# Patient Record
Sex: Female | Born: 1970 | Race: Black or African American | Hispanic: No | Marital: Single | Smoking: Former smoker
Health system: Southern US, Community
[De-identification: ages and names within clinical notes are randomized; demographics above are authoritative.]

## PROBLEM LIST (undated history)

## (undated) HISTORY — PX: TUBAL LIGATION: SHX77

---

## 2019-08-30 ENCOUNTER — Encounter (HOSPITAL_BASED_OUTPATIENT_CLINIC_OR_DEPARTMENT_OTHER): Payer: Self-pay | Admitting: *Deleted

## 2019-08-30 ENCOUNTER — Emergency Department (HOSPITAL_BASED_OUTPATIENT_CLINIC_OR_DEPARTMENT_OTHER): Payer: Self-pay

## 2019-08-30 ENCOUNTER — Emergency Department (HOSPITAL_BASED_OUTPATIENT_CLINIC_OR_DEPARTMENT_OTHER)
Admission: EM | Admit: 2019-08-30 | Discharge: 2019-08-30 | Disposition: A | Discharge status: Home / Self Care | Payer: Self-pay | Attending: Emergency Medicine | Admitting: Emergency Medicine

## 2019-08-30 ENCOUNTER — Other Ambulatory Visit: Payer: Self-pay

## 2019-08-30 DIAGNOSIS — Z87891 Personal history of nicotine dependence: Secondary | ICD-10-CM | POA: Insufficient documentation

## 2019-08-30 DIAGNOSIS — K219 Gastro-esophageal reflux disease without esophagitis: Secondary | ICD-10-CM | POA: Insufficient documentation

## 2019-08-30 DIAGNOSIS — I10 Essential (primary) hypertension: Secondary | ICD-10-CM | POA: Insufficient documentation

## 2019-08-30 DIAGNOSIS — K2100 Gastro-esophageal reflux disease with esophagitis, without bleeding: Secondary | ICD-10-CM

## 2019-08-30 LAB — CBC
HCT: 35.1 % — ABNORMAL LOW (ref 36.0–46.0)
Hemoglobin: 11.8 g/dL — ABNORMAL LOW (ref 12.0–15.0)
MCH: 27.6 pg (ref 26.0–34.0)
MCHC: 33.6 g/dL (ref 30.0–36.0)
MCV: 82 fL (ref 80.0–100.0)
Platelets: 274 10*3/uL (ref 150–400)
RBC: 4.28 MIL/uL (ref 3.87–5.11)
RDW: 14.7 % (ref 11.5–15.5)
WBC: 8.1 10*3/uL (ref 4.0–10.5)
nRBC: 0 % (ref 0.0–0.2)

## 2019-08-30 LAB — BASIC METABOLIC PANEL
Anion gap: 9 (ref 5–15)
BUN: 10 mg/dL (ref 6–20)
CO2: 27 mmol/L (ref 22–32)
Calcium: 9.8 mg/dL (ref 8.9–10.3)
Chloride: 101 mmol/L (ref 98–111)
Creatinine, Ser: 0.68 mg/dL (ref 0.44–1.00)
GFR calc Af Amer: >60 mL/min (ref >60)
GFR calc non Af Amer: >60 mL/min (ref >60)
Glucose, Bld: 94 mg/dL (ref 70–99)
Potassium: 3.6 mmol/L (ref 3.5–5.1)
Sodium: 137 mmol/L (ref 135–145)

## 2019-08-30 LAB — TROPONIN I (HIGH SENSITIVITY): Troponin I (High Sensitivity): 3 ng/L (ref <18)

## 2019-08-30 MED ORDER — LISINOPRIL-HYDROCHLOROTHIAZIDE 10-12.5 MG PO TABS: 1.0000 | ORAL_TABLET | Freq: Every day | ORAL | 1 refills | Status: AC

## 2019-08-30 MED ORDER — PANTOPRAZOLE SODIUM 20 MG PO TBEC: 20.0000 mg | DELAYED_RELEASE_TABLET | Freq: Every day | ORAL | 0 refills | Status: AC

## 2019-08-30 MED ORDER — LIDOCAINE VISCOUS HCL 2 % MT SOLN
15.0000 mL | Freq: Once | OROMUCOSAL | Status: AC
  Administered 2019-08-30: 15 mL via ORAL
  Filled 2019-08-30: qty 15

## 2019-08-30 MED ORDER — ALUM & MAG HYDROXIDE-SIMETH 200-200-20 MG/5ML PO SUSP
30.0000 mL | Freq: Once | ORAL | Status: AC
  Administered 2019-08-30: 16:00:00 30 mL via ORAL
  Filled 2019-08-30: qty 30

## 2019-08-30 NOTE — Discharge Instructions (Signed)
Please review the instructions about hypertension and acid reflux.  Follow-up with your primary care doctor to check on your response to the blood pressure medication and for continued management of your high blood pressure.  Depending on how you respond to the antacids he may want to see a GI doctor but you could also discuss this with the primary care doctor initially.

## 2019-08-30 NOTE — ED Provider Notes (Signed)
[facility] EMERGENCY DEPARTMENT Provider Note   CSN: [id number] Arrival date & time: 08/30/19  1549     History Chief Complaint  Patient presents with  . throat pain    Angela Lowery is a 48 y.o. female.  HPI  HPI: A 48 year old patient with a history of hypertension presents for evaluation of chest pain. Initial onset of pain was more than 6 hours ago. The patient's chest pain is not worse with exertion. The patient's chest pain is middle- or left-sided, is not well-localized, is not described as heaviness/pressure/tightness, is not sharp and does radiate to the arms/jaw/neck. The patient does not complain of nausea and denies diaphoresis. The patient has smoked in the past 90 days. The patient has no history of stroke, has no history of peripheral artery disease, denies any history of treated diabetes, has no relevant family history of coronary artery disease (first degree relative at less than age 48), has no history of hypercholesterolemia and does not have an elevated BMI (>=30). Patient presents ED with complaints of burning sensation in her throat and chest.  Patient states initially started a couple days ago.  She felt started after eating some spicy foods.  Ever since then she has had some persistent pain in her chest area.  Patient has had that discomfort in her chest going into her back and neck.  Patient was concerned because she thought she felt in her heart.  Patient states she has noticed that when she eats certain foods it seems to come back on again and get worse.  Antacids such as Tums seem to help.  She denies any fevers or chills.  No nausea vomiting.  No diaphoresis or shortness of breath.  Patient is not aware of her history of high blood pressure but family members do have it.  She has not had routine checkups.  Patient does smoke.  No history of early heart disease in the family.  History reviewed. No pertinent past medical history.  There are no problems  to display for this patient.   Past Surgical History:  Procedure Laterality Date  . TUBAL LIGATION       OB History   No obstetric history on file.     History reviewed. No pertinent family history.  Social History   Tobacco Use  . Smoking status: Former Games developermoker  . Smokeless tobacco: Never Used  Substance Use Topics  . Alcohol use: Not Currently  . Drug use: Not on file    Home Medications Prior to Admission medications   Medication Sig Start Date End Date Taking? Authorizing Provider  lisinopril-hydrochlorothiazide (ZESTORETIC) 10-12.5 MG tablet Take 1 tablet by mouth daily. 08/30/19   Linwood DibblesKnapp, Promiss Labarbera, MD  pantoprazole (PROTONIX) 20 MG tablet Take 1 tablet (20 mg total) by mouth daily. 08/30/19   Linwood DibblesKnapp, Tammy Ericsson, MD    Allergies    Patient has no known allergies.  Review of Systems   Review of Systems  All other systems reviewed and are negative.   Physical Exam Updated Vital Signs BP (!) 179/121   Pulse (!) 59   Temp 98.3 F (36.8 C) (Oral)   Resp 14   Ht 1.575 m (5\' 2" )   Wt 50.8 kg   LMP 08/22/2019   SpO2 100%   BMI 20.49 kg/m   Physical Exam Vitals and nursing note reviewed.  Constitutional:      General: She is not in acute distress.    Appearance: She is well-developed.  HENT:  Head: Normocephalic and atraumatic.     Right Ear: External ear normal.     Left Ear: External ear normal.  Eyes:     General: No scleral icterus.       Right eye: No discharge.        Left eye: No discharge.     Conjunctiva/sclera: Conjunctivae normal.  Neck:     Trachea: No tracheal deviation.  Cardiovascular:     Rate and Rhythm: Normal rate and regular rhythm.     Comments: Normal pulses all 4 extremities Pulmonary:     Effort: Pulmonary effort is normal. No respiratory distress.     Breath sounds: Normal breath sounds. No stridor. No wheezing or rales.  Abdominal:     General: Bowel sounds are normal. There is no distension.     Palpations: Abdomen is soft.      Tenderness: There is no abdominal tenderness. There is no guarding or rebound.  Musculoskeletal:        General: No tenderness.     Cervical back: Neck supple.  Skin:    General: Skin is warm and dry.     Findings: No rash.  Neurological:     Mental Status: She is alert.     Cranial Nerves: No cranial nerve deficit (no facial droop, extraocular movements intact, no slurred speech).     Sensory: No sensory deficit.     Motor: No abnormal muscle tone or seizure activity.     Coordination: Coordination normal.     ED Results / Procedures / Treatments   Labs (all labs ordered are listed, but only abnormal results are displayed) Labs Reviewed  CBC - Abnormal; Notable for the following components:      Result Value   Hemoglobin 11.8 (*)    HCT 35.1 (*)    All other components within normal limits  BASIC METABOLIC PANEL  TROPONIN I (HIGH SENSITIVITY)    EKG EKG Interpretation  Date/Time:  Thursday August 30 2019 16:20:53 EST Ventricular Rate:  65 PR Interval:  156 QRS Duration: 82 QT Interval:  382 QTC Calculation: 397 R Axis:   79 Text Interpretation: Normal sinus rhythm Normal ECG No significant change since last tracing Confirmed by Linwood Dibbles [phone]) on 08/30/2019 4:32:39 PM   Radiology DG Chest 2 View  Result Date: 08/30/2019 CLINICAL DATA:  Pt c/o "something stuck in throat and chest burning with mid chest tightness " x 3 days. High BP x today. HX: Tubal ligation, LMP x 08/22/19 Former smoker EXAM: CHEST - 2 VIEW COMPARISON:  None. FINDINGS: Normal heart, mediastinum and hila. Minor scarring in the lung apices. Lungs are otherwise clear. No pleural effusion or pneumothorax. No evidence of a radiopaque foreign body on the included field of view. Skeletal structures are unremarkable. IMPRESSION: No active cardiopulmonary disease. Electronically Signed   By: Amie Portland M.D.   On: 08/30/2019 17:25    Procedures Procedures (including critical care time)  Medications  Ordered in ED Medications  alum & mag hydroxide-simeth (MAALOX/MYLANTA) 200-200-20 MG/5ML suspension 30 mL (30 mLs Oral Given 08/30/19 1622)    And  lidocaine (XYLOCAINE) 2 % viscous mouth solution 15 mL (15 mLs Oral Given 08/30/19 1622)    ED Course  I have reviewed the triage vital signs and the nursing notes.  Pertinent labs & imaging results that were available during my care of the patient were reviewed by me and considered in my medical decision making (see chart for details).  Clinical Course as  of Aug 30 1827  Thu Aug 30, 2019  1755 Patient's ED work-up is reassuring.  Laboratory tests are unremarkable.   [JK]    Clinical Course User Index [JK] Dorie Rank, MD   MDM Rules/Calculators/A&P HEAR Score: 3                    Patient presented to the emergency room with complaints of symptoms most suggestive of acid reflux however she was noted to be hypertensive and was having some symptoms that potentially could have been related to coronary artery disease.  Symptoms were not suggestive of pulmonary embolism or aortic dissection.  Patient's ED work-up is reassuring.  Her troponin is normal.  Patient has a low risk hear score and a normal troponin.  I discussed these findings with the patient.  I will start her on a course of antacids.  Also given prescription for lisinopril hydrochlorothiazide considering her hypertension.  I recommend she follow-up with her primary care doctor.  I will also give her a referral to gastroenterology if her symptoms do not respond to antacids. Final Clinical Impression(s) / ED Diagnoses Final diagnoses:  Gastroesophageal reflux disease with esophagitis without hemorrhage  Essential hypertension    Rx / DC Orders ED Discharge Orders         Ordered    pantoprazole (PROTONIX) 20 MG tablet  Daily     08/30/19 1827    lisinopril-hydrochlorothiazide (ZESTORETIC) 10-12.5 MG tablet  Daily     08/30/19 1827           Dorie Rank, MD 08/30/19  1830

## 2019-08-30 NOTE — ED Triage Notes (Addendum)
Pt c/o "something stuck in throat and burning " x 3 days

## 2019-08-30 NOTE — ED Notes (Signed)
ED Provider at bedside. 

## 2019-09-28 ENCOUNTER — Ambulatory Visit: Payer: Self-pay | Admitting: Internal Medicine

## 2020-10-02 IMAGING — DX DG CHEST 2V
2 series · 2 of 2 positions shown · non-contrast
Comparison: None.

CLINICAL DATA: Pt c/o "something stuck in throat and chest burning
with mid chest tightness " x 3 days. High BP x today. HX: Tubal
ligation, LMP x 08/22/19 Former smoker

EXAM:
CHEST - 2 VIEW

[chest pa]
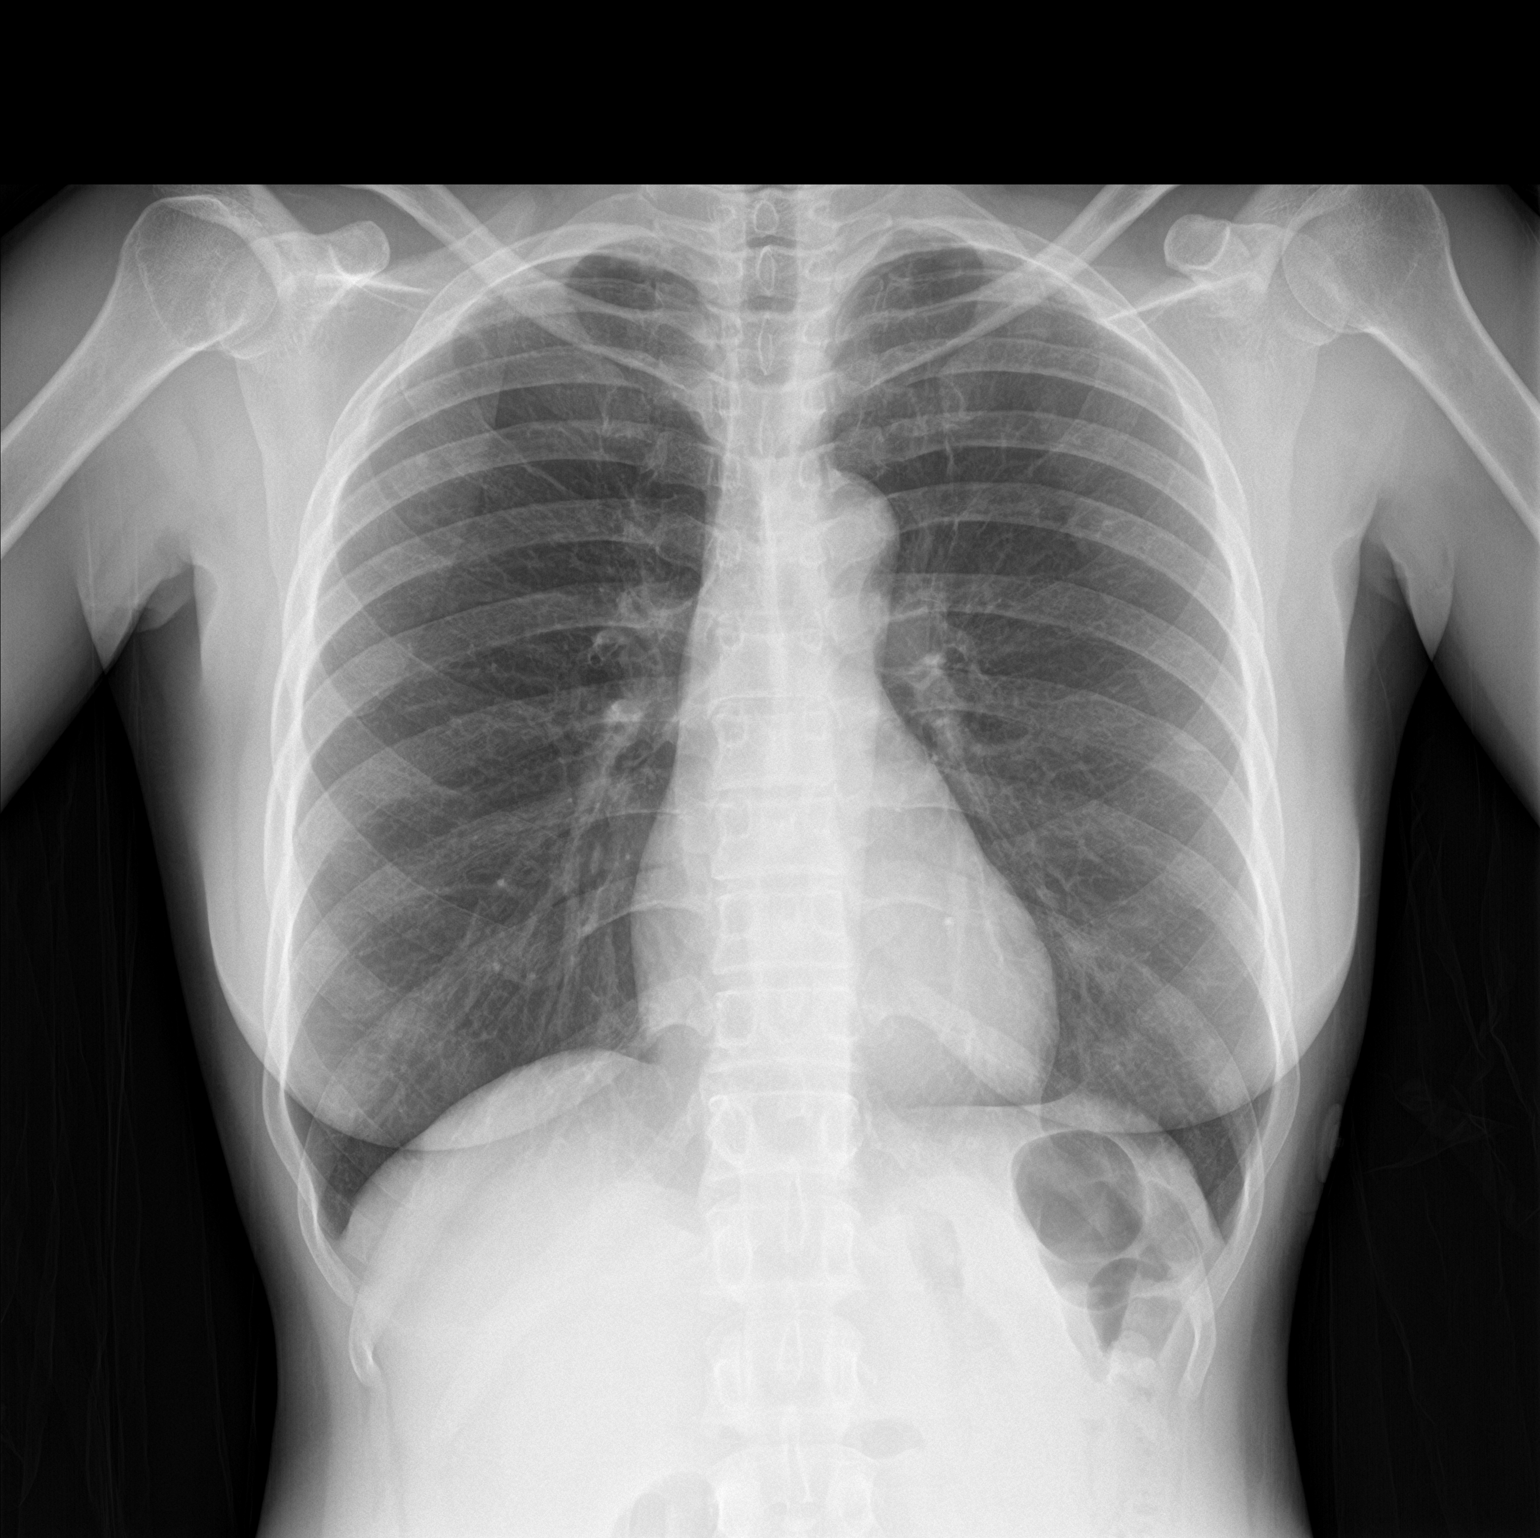

[chest lat]
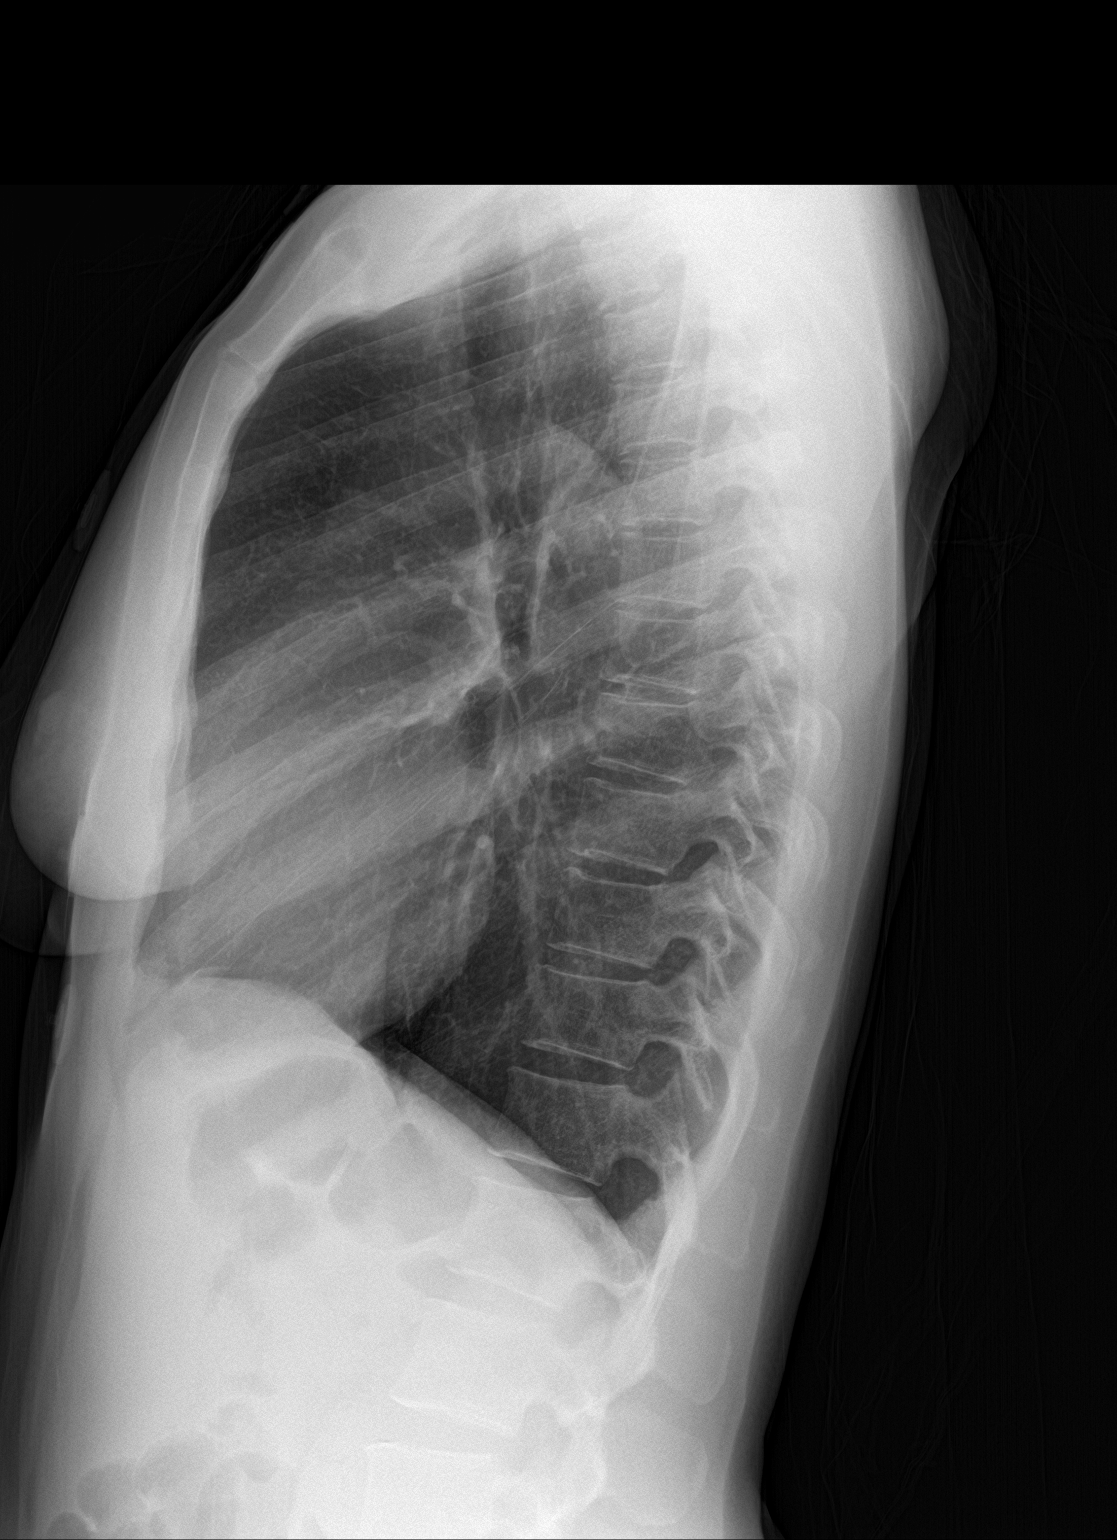

[2 of 2 positions shown; findings below may reference images not displayed]

FINDINGS: Normal heart, mediastinum and hila.

Minor scarring in the lung apices. Lungs are otherwise clear. No
pleural effusion or pneumothorax.

No evidence of a radiopaque foreign body on the included field of
view.

Skeletal structures are unremarkable.
IMPRESSION: No active cardiopulmonary disease.
